# Patient Record
Sex: Female | Born: 1960 | Race: White | Hispanic: No | Smoking: Never smoker
Health system: Southern US, Community
[De-identification: ages and names within clinical notes are randomized; demographics above are authoritative.]

## PROBLEM LIST (undated history)

## (undated) DIAGNOSIS — M810 Age-related osteoporosis without current pathological fracture: Secondary | ICD-10-CM

## (undated) DIAGNOSIS — G4700 Insomnia, unspecified: Secondary | ICD-10-CM

## (undated) HISTORY — DX: Age-related osteoporosis without current pathological fracture: M81.0

## (undated) HISTORY — DX: Insomnia, unspecified: G47.00

---

## 1997-07-28 ENCOUNTER — Other Ambulatory Visit: Admission: RE | Admit: 1997-07-28 | Discharge: 1997-07-28 | Payer: Self-pay | Admitting: *Deleted

## 1998-01-30 ENCOUNTER — Other Ambulatory Visit: Admission: RE | Admit: 1998-01-30 | Discharge: 1998-01-30 | Payer: Self-pay | Admitting: Obstetrics & Gynecology

## 1998-09-13 ENCOUNTER — Inpatient Hospital Stay (HOSPITAL_COMMUNITY): Admission: AD | Admit: 1998-09-13 | Discharge: 1998-09-15 | Payer: Self-pay | Admitting: Obstetrics & Gynecology

## 1998-09-13 ENCOUNTER — Encounter (INDEPENDENT_AMBULATORY_CARE_PROVIDER_SITE_OTHER): Payer: Self-pay | Admitting: Specialist

## 1998-10-31 ENCOUNTER — Other Ambulatory Visit: Admission: RE | Admit: 1998-10-31 | Discharge: 1998-10-31 | Payer: Self-pay | Admitting: Obstetrics & Gynecology

## 2000-02-01 ENCOUNTER — Other Ambulatory Visit: Admission: RE | Admit: 2000-02-01 | Discharge: 2000-02-01 | Payer: Self-pay | Admitting: Obstetrics and Gynecology

## 2000-03-06 ENCOUNTER — Encounter: Admission: RE | Admit: 2000-03-06 | Discharge: 2000-03-06 | Payer: Self-pay | Admitting: Internal Medicine

## 2002-01-21 DIAGNOSIS — I82409 Acute embolism and thrombosis of unspecified deep veins of unspecified lower extremity: Secondary | ICD-10-CM

## 2002-01-21 HISTORY — DX: Acute embolism and thrombosis of unspecified deep veins of unspecified lower extremity: I82.409

## 2003-04-20 ENCOUNTER — Other Ambulatory Visit: Admission: RE | Admit: 2003-04-20 | Discharge: 2003-04-20 | Payer: Self-pay | Admitting: Obstetrics and Gynecology

## 2004-05-01 ENCOUNTER — Other Ambulatory Visit: Admission: RE | Admit: 2004-05-01 | Discharge: 2004-05-01 | Payer: Self-pay | Admitting: Obstetrics and Gynecology

## 2004-12-14 ENCOUNTER — Ambulatory Visit (HOSPITAL_COMMUNITY): Admission: RE | Admit: 2004-12-14 | Discharge: 2004-12-14 | Payer: Self-pay | Admitting: Emergency Medicine

## 2004-12-17 ENCOUNTER — Ambulatory Visit (HOSPITAL_COMMUNITY): Admission: RE | Admit: 2004-12-17 | Discharge: 2004-12-17 | Payer: Self-pay | Admitting: Emergency Medicine

## 2005-05-02 ENCOUNTER — Other Ambulatory Visit: Admission: RE | Admit: 2005-05-02 | Discharge: 2005-05-02 | Payer: Self-pay | Admitting: Obstetrics and Gynecology

## 2006-06-19 ENCOUNTER — Other Ambulatory Visit: Admission: RE | Admit: 2006-06-19 | Discharge: 2006-06-19 | Payer: Self-pay | Admitting: Obstetrics and Gynecology

## 2007-07-03 ENCOUNTER — Other Ambulatory Visit: Admission: RE | Admit: 2007-07-03 | Discharge: 2007-07-03 | Payer: Self-pay | Admitting: Obstetrics and Gynecology

## 2008-07-12 ENCOUNTER — Other Ambulatory Visit: Admission: RE | Admit: 2008-07-12 | Discharge: 2008-07-12 | Payer: Self-pay | Admitting: Obstetrics and Gynecology

## 2009-09-26 ENCOUNTER — Encounter: Admission: RE | Admit: 2009-09-26 | Discharge: 2009-09-26 | Payer: Self-pay | Admitting: Family Medicine

## 2009-09-28 ENCOUNTER — Other Ambulatory Visit: Admission: RE | Admit: 2009-09-28 | Discharge: 2009-09-28 | Payer: Self-pay | Admitting: Obstetrics and Gynecology

## 2010-10-05 ENCOUNTER — Emergency Department (HOSPITAL_COMMUNITY)
Admission: EM | Admit: 2010-10-05 | Discharge: 2010-10-05 | Disposition: A | Discharge status: Home / Self Care | Payer: BC Managed Care – PPO | Attending: Emergency Medicine | Admitting: Emergency Medicine

## 2010-10-05 ENCOUNTER — Emergency Department (HOSPITAL_COMMUNITY): Payer: BC Managed Care – PPO

## 2010-10-05 DIAGNOSIS — F411 Generalized anxiety disorder: Secondary | ICD-10-CM | POA: Insufficient documentation

## 2010-10-05 DIAGNOSIS — Z86718 Personal history of other venous thrombosis and embolism: Secondary | ICD-10-CM | POA: Insufficient documentation

## 2010-10-05 DIAGNOSIS — Z79899 Other long term (current) drug therapy: Secondary | ICD-10-CM | POA: Insufficient documentation

## 2010-10-05 DIAGNOSIS — R079 Chest pain, unspecified: Secondary | ICD-10-CM | POA: Insufficient documentation

## 2010-10-05 LAB — CBC
Platelets: 237 10*3/uL (ref 150–400)
RBC: 4.8 MIL/uL (ref 3.87–5.11)
RDW: 12.1 % (ref 11.5–15.5)
WBC: 5.9 10*3/uL (ref 4.0–10.5)

## 2010-10-05 LAB — DIFFERENTIAL
Basophils Absolute: 0 10*3/uL (ref 0.0–0.1)
Eosinophils Absolute: 0.1 10*3/uL (ref 0.0–0.7)
Eosinophils Relative: 2 % (ref 0–5)
Lymphocytes Relative: 36 % (ref 12–46)
Neutrophils Relative %: 51 % (ref 43–77)

## 2010-10-05 LAB — D-DIMER, QUANTITATIVE: D-Dimer, Quant: 0.35 ug/mL-FEU (ref 0.00–0.48)

## 2010-10-05 LAB — POCT I-STAT TROPONIN I: Troponin i, poc: 0.01 ng/mL (ref 0.00–0.08)

## 2010-10-05 LAB — BASIC METABOLIC PANEL
CO2: 27 mEq/L (ref 19–32)
Chloride: 104 mEq/L (ref 96–112)
GFR calc Af Amer: >60 mL/min (ref >60)
Potassium: 3.7 mEq/L (ref 3.5–5.1)
Sodium: 138 mEq/L (ref 135–145)

## 2011-05-21 ENCOUNTER — Ambulatory Visit (INDEPENDENT_AMBULATORY_CARE_PROVIDER_SITE_OTHER): Payer: BC Managed Care – PPO | Admitting: Internal Medicine

## 2011-05-21 VITALS — BP 129/76 | HR 67 | Temp 97.9°F | Resp 16 | Ht 68.75 in | Wt 183.2 lb

## 2011-05-21 DIAGNOSIS — H9202 Otalgia, left ear: Secondary | ICD-10-CM

## 2011-05-21 DIAGNOSIS — H9209 Otalgia, unspecified ear: Secondary | ICD-10-CM

## 2011-05-21 MED ORDER — PREDNISONE 20 MG PO TABS: ORAL_TABLET | ORAL | Status: AC

## 2011-05-21 MED ORDER — NAPROXEN 500 MG PO TABS: 500.0000 mg | ORAL_TABLET | Freq: Two times a day (BID) | ORAL | Status: AC

## 2011-05-21 MED ORDER — CYCLOBENZAPRINE HCL 10 MG PO TABS: 10.0000 mg | ORAL_TABLET | Freq: Three times a day (TID) | ORAL | Status: AC | PRN

## 2011-05-21 NOTE — Patient Instructions (Signed)
Check with your dentist regarding whether or not you have wisdom teeth that could be contributing or causing problems.  AVOID HOLDING THE TELEPHONE WITH YOUR LEFT EAR.  Take the flexeril, naproxen, and prednisone taper and if your problems persist, see your primary care health provider.  You may need a referral or imaging study to determine the cause of your pain.

## 2011-05-21 NOTE — Progress Notes (Signed)
  Subjective:    Patient ID: Kayla Montoya, female    DOB: [birth year], 51 y.o.   MRN: [id number]  HPI  Kayla Montoya is a 51 year old WF here for pain in her upper left neck and left ear area.  She was seen at the urgent care at [facility] and given a short course of prednisone, mobic which helped for a time but now her pain seems to be more in her left jaw area, hurts when she talks or if she touches her tragus on the left.  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  She does not remember having her wisdom teeth out, she goes regularly to the dentist and is in good health.  She did get relief from the prednisone, though the mobic does not seem to help, she complains of a burning pain in her left ear at times.   Review of Systems  Constitutional: Negative for chills and fatigue.  HENT: Positive for neck pain and neck stiffness. Negative for congestion, rhinorrhea and postnasal drip.   Eyes: Negative.   Respiratory: Negative.   Cardiovascular: Negative.   Gastrointestinal: Negative.   Genitourinary: Negative.   Musculoskeletal: Positive for myalgias.  Skin: Negative.   Neurological: Negative.   Hematological: Negative.   Psychiatric/Behavioral: Negative.   All other systems reviewed and are negative.       Objective:   Physical Exam  Vitals reviewed. Constitutional: She appears well-developed and well-nourished.  HENT:  Head: Normocephalic and atraumatic.  Right Ear: External ear normal.  Left Ear: External ear normal.  Mouth/Throat: Oropharynx is clear and moist.       Left TM is clear, no swelling or erythema in left IAC.  Eyes: Left eye exhibits no discharge.  Neck: Neck supple. No tracheal deviation present. No thyromegaly present.  Cardiovascular: Normal rate, regular rhythm and normal heart sounds.   Pulmonary/Chest: Effort normal and breath sounds normal. She has no wheezes. She has no rales.  Abdominal: Soft.  Musculoskeletal:       Very tight upper left trapezius  with muscle spasm.  Lymphadenopathy:    She has no cervical adenopathy.  Skin: Skin is warm and dry.  Psychiatric: She has a normal mood and affect. Her behavior is normal.          Assessment & Plan:  Suspect her pain is due to musculoskeletal strain.  She is given neck exercises, naproxen, prednisone taper and flexeril.  SHe must stop holding the phone to her left ear!  She can get another device to use to talk on the phone.  She is advised to RTC or to her PCP if this persists as she may need an imaging study or referral if her pain persists, she agrees with this plan. AVS printed and given pt.Marland Kitchen

## 2012-01-10 ENCOUNTER — Ambulatory Visit (INDEPENDENT_AMBULATORY_CARE_PROVIDER_SITE_OTHER): Payer: BC Managed Care – PPO | Admitting: Emergency Medicine

## 2012-01-10 VITALS — BP 144/82 | HR 81 | Temp 98.6°F | Resp 16 | Ht 68.5 in | Wt 166.0 lb

## 2012-01-10 DIAGNOSIS — R059 Cough, unspecified: Secondary | ICD-10-CM

## 2012-01-10 DIAGNOSIS — J209 Acute bronchitis, unspecified: Secondary | ICD-10-CM

## 2012-01-10 DIAGNOSIS — R05 Cough: Secondary | ICD-10-CM

## 2012-01-10 LAB — POCT INFLUENZA A/B
Influenza A, POC: NEGATIVE
Influenza B, POC: NEGATIVE

## 2012-01-10 MED ORDER — HYDROCOD POLST-CHLORPHEN POLST 10-8 MG/5ML PO LQCR: 5.0000 mL | Freq: Two times a day (BID) | ORAL | Status: DC | PRN

## 2012-01-10 MED ORDER — AZITHROMYCIN 250 MG PO TABS: ORAL_TABLET | ORAL | Status: DC

## 2012-01-10 NOTE — Progress Notes (Signed)
[facility] [address] [place] [address] [phone]  Date:  01/10/2012   Name:  Kayla Montoya   DOB:  06/14/1960   MRN:  [id number]  PCP:  No primary provider on file.    Chief Complaint: Cough   History of Present Illness:  Kayla Montoya is a 51 y.o. very pleasant female patient who presents with the following:  Ill past two days with a cough that is not productive and a mucoid post nasal drip.  She has felt "miserable" and unable to work yesterday and tried unsuccessfully today.  Has no fever or chills, nausea or vomiting.  No rash.  Had some loose stool today no blood mucous or pus in stools.  There is no problem list on file for this patient.   History reviewed. No pertinent past medical history.  History reviewed. No pertinent past surgical history.  History  Substance Use Topics  . Smoking status: Never Smoker   . Smokeless tobacco: Not on file  . Alcohol Use: Not on file    History reviewed. No pertinent family history.  No Known Allergies  Medication list has been reviewed and updated.  Current Outpatient Prescriptions on File Prior to Visit  Medication Sig Dispense Refill  . beta carotene w/minerals (OCUVITE) tablet Take 1 tablet by mouth daily.      . calcium-vitamin D (OSCAL WITH D) 500-200 MG-UNIT per tablet Take 1 tablet by mouth daily.      . naproxen (NAPROSYN) 500 MG tablet Take 1 tablet (500 mg total) by mouth 2 (two) times daily with a meal.  30 tablet  0  . zolpidem (AMBIEN) 5 MG tablet Take 5 mg by mouth at bedtime as needed.        Review of Systems:  As per HPI, otherwise negative.    Physical Examination: Filed Vitals:   01/10/12 1431  BP: 144/82  Pulse: 81  Temp: 98.6 F (37 C)  Resp: 16   Filed Vitals:   01/10/12 1431  Height: 5' 8.5" (1.74 m)  Weight: 166 lb (75.297 kg)   Body mass index is 24.87 kg/(m^2). Ideal Body Weight: Weight in (lb) to have BMI = 25: 166.5   GEN: WDWN, NAD,  Non-toxic, A & O x 3 HEENT: Atraumatic, Normocephalic. Neck supple. No masses, No LAD. Ears and Nose: No external deformity. CV: RRR, No M/G/R. No JVD. No thrill. No extra heart sounds. PULM: CTA B, no wheezes, crackles, rhonchi. No retractions. No resp. distress. No accessory muscle use. ABD: S, NT, ND, +BS. No rebound. No HSM. EXTR: No c/c/e NEURO Normal gait.  PSYCH: Normally interactive. Conversant. Not depressed or anxious appearing.  Calm demeanor.    Assessment and Plan: Bronchitis zpak tussionex   Carmelina Dane, MD  Results for orders placed in visit on 01/10/12  POCT INFLUENZA A/B      Component Value Range   Influenza A, POC Negative     Influenza B, POC Negative

## 2012-10-01 IMAGING — CR DG CHEST 2V
2 series · 2 of 2 positions shown · non-contrast
Comparison: None.

CLINICAL DATA: Chest pain and burning.

CHEST - 2 VIEW

[w chest pa]
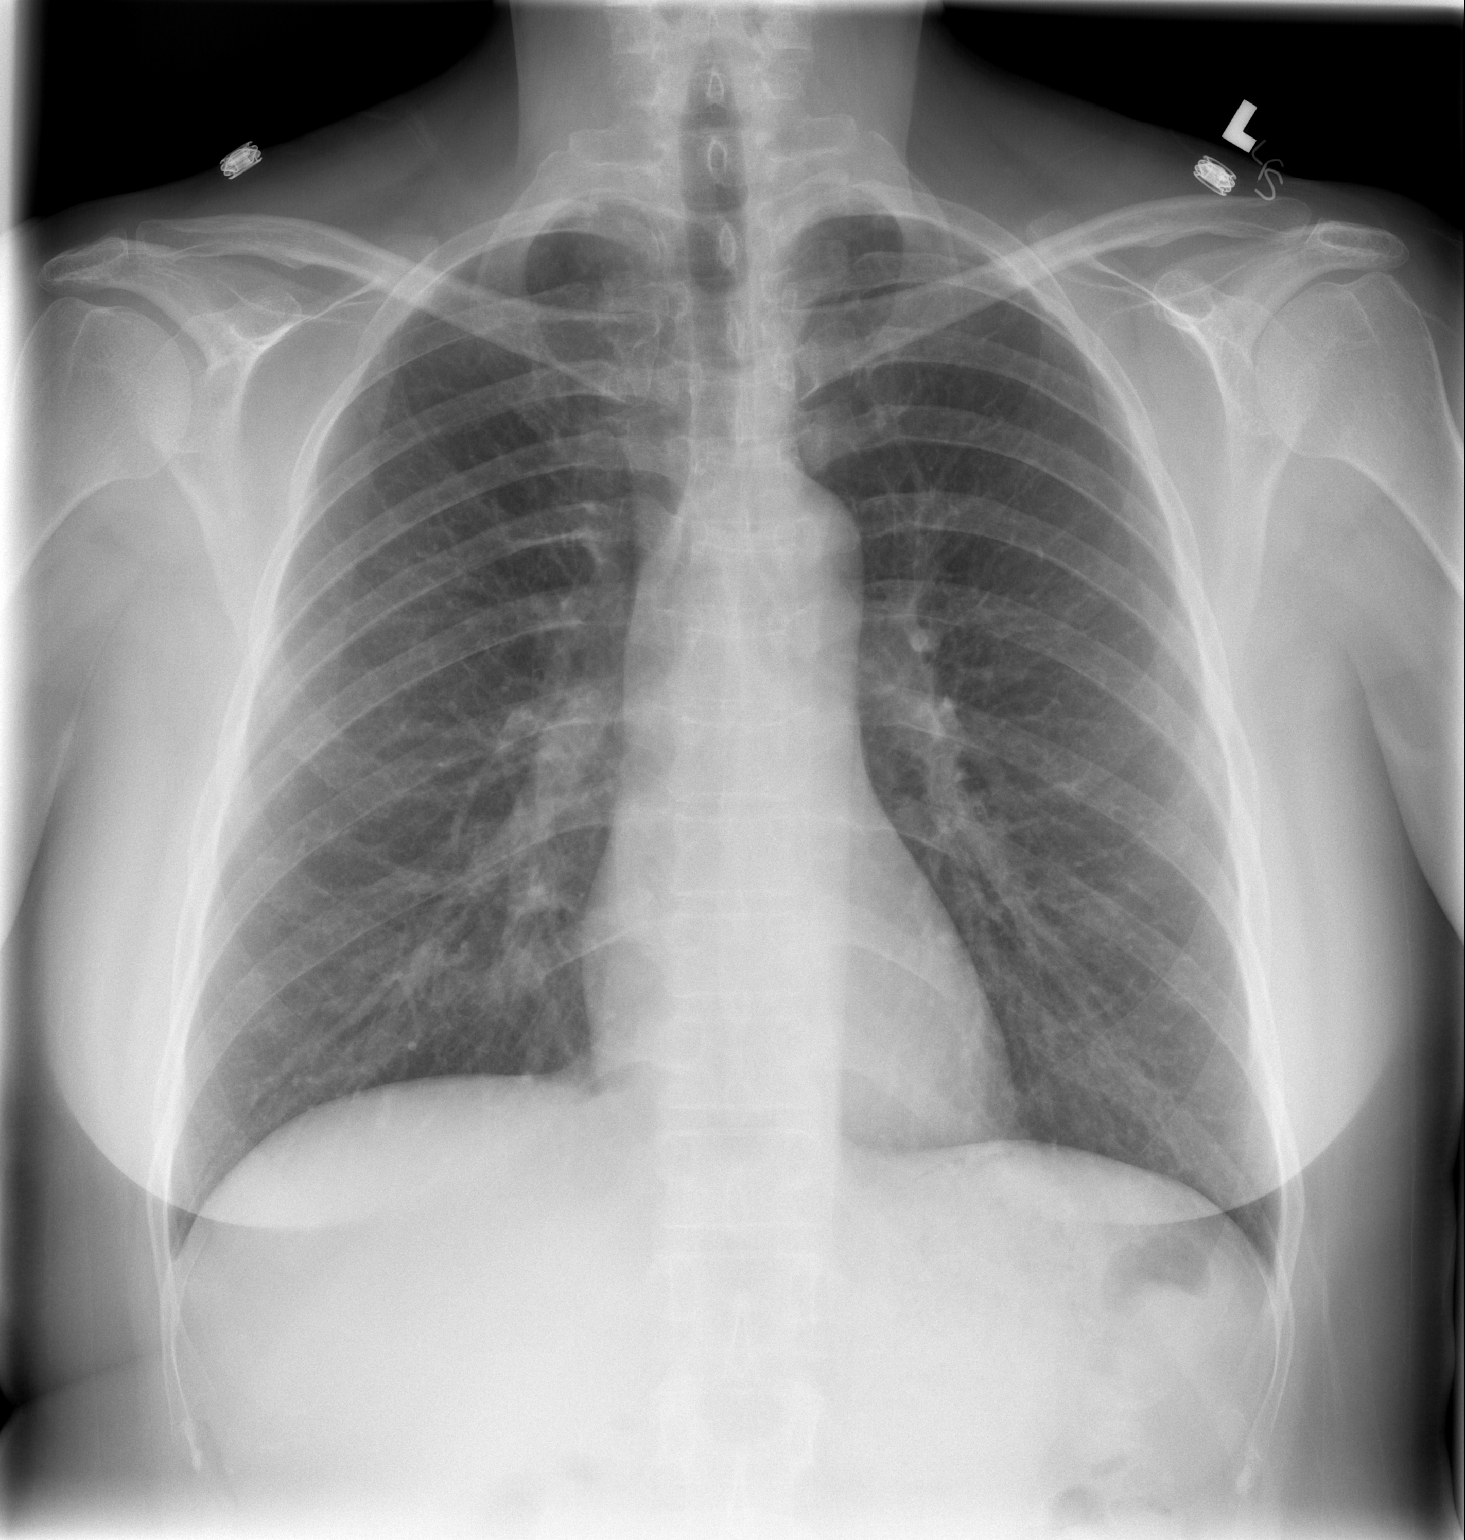

[w chest lat]
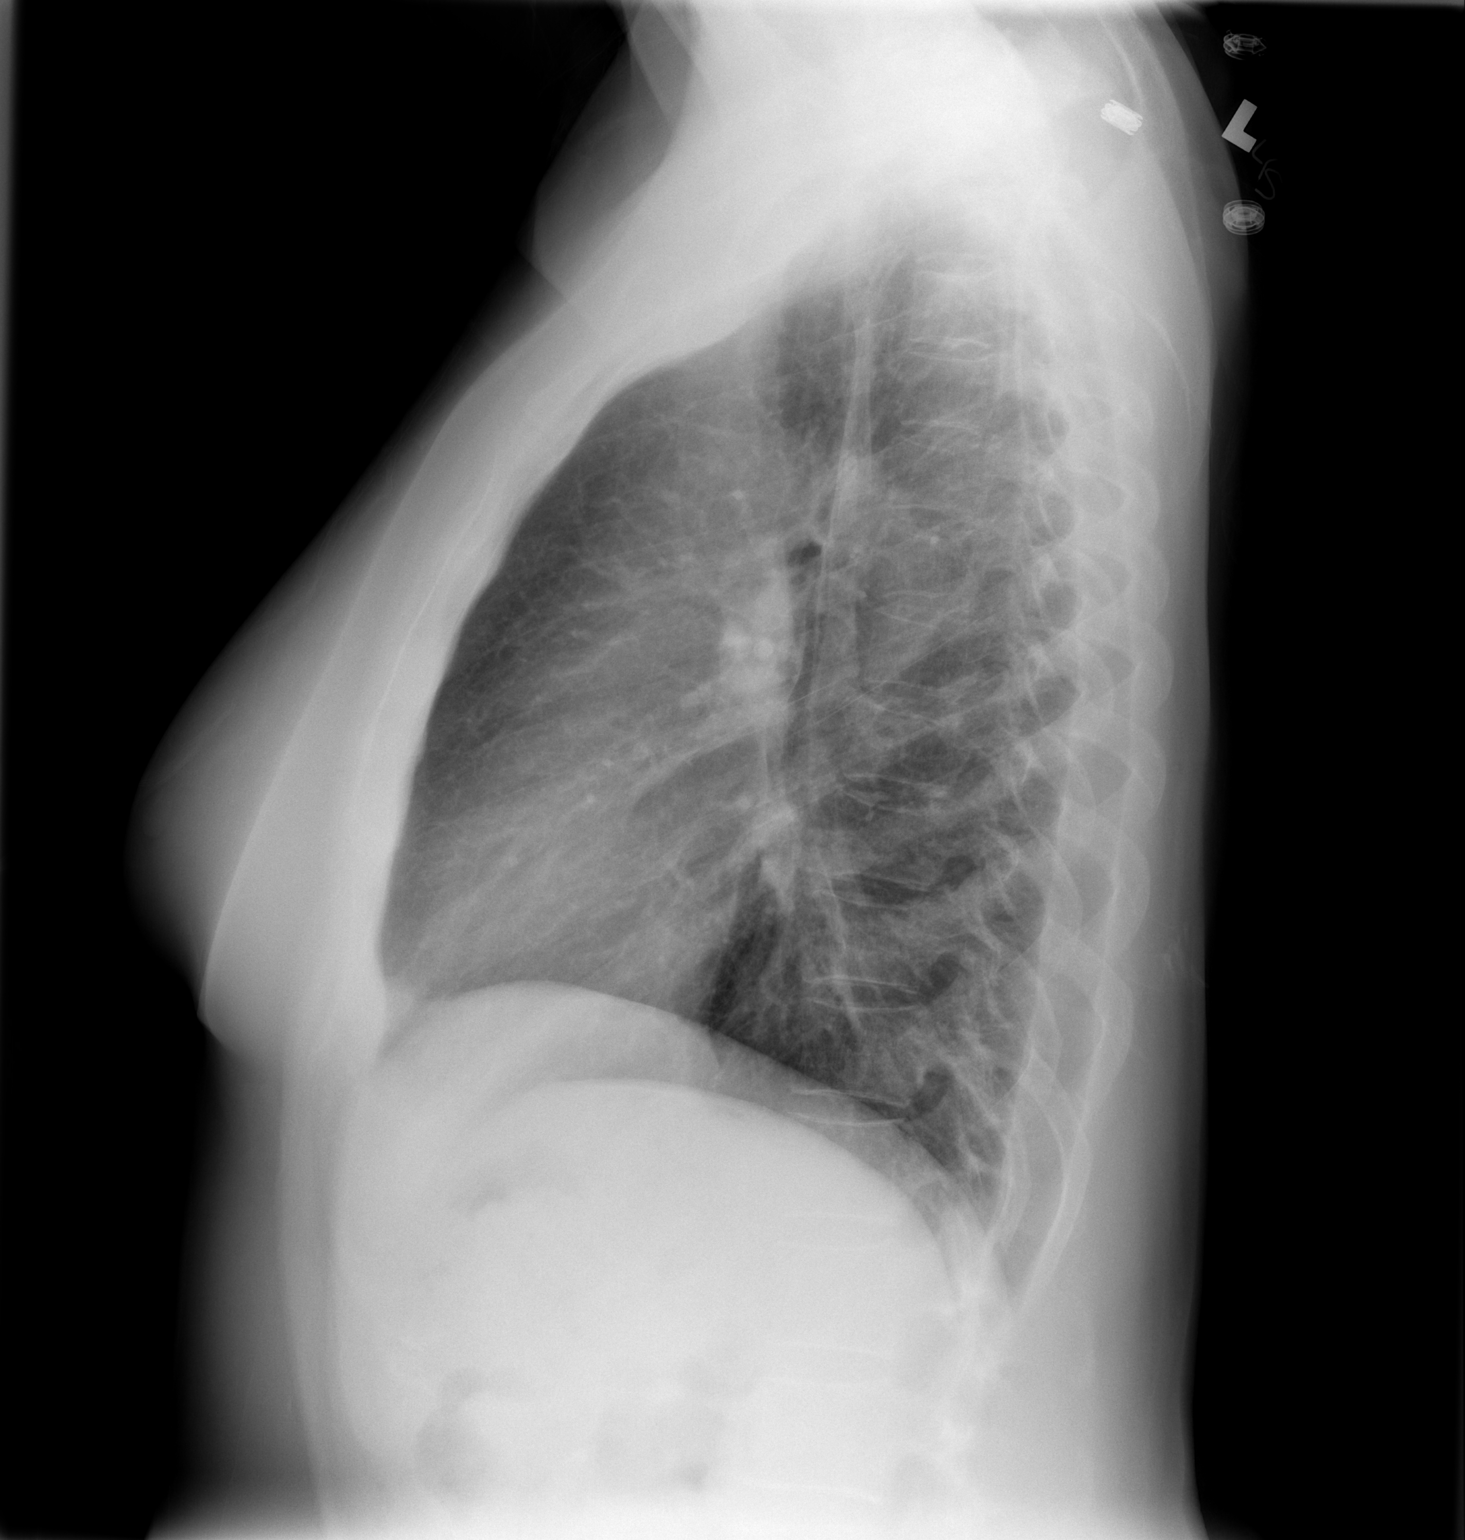

[2 of 2 positions shown; findings below may reference images not displayed]

FINDINGS: The lungs are well-aerated and clear.  There is no
evidence of focal opacification, pleural effusion or pneumothorax.

The heart is normal in size; the mediastinal contour is within
normal limits.  No acute osseous abnormalities are seen.
IMPRESSION: No acute cardiopulmonary process seen.

## 2012-10-29 ENCOUNTER — Other Ambulatory Visit: Payer: Self-pay | Admitting: Nurse Practitioner

## 2012-10-29 ENCOUNTER — Other Ambulatory Visit (HOSPITAL_COMMUNITY)
Admission: RE | Admit: 2012-10-29 | Discharge: 2012-10-29 | Disposition: A | Discharge status: Home / Self Care | Payer: BC Managed Care – PPO | Source: Ambulatory Visit | Attending: Nurse Practitioner | Admitting: Nurse Practitioner

## 2012-10-29 DIAGNOSIS — Z1151 Encounter for screening for human papillomavirus (HPV): Secondary | ICD-10-CM | POA: Insufficient documentation

## 2012-10-29 DIAGNOSIS — Z01419 Encounter for gynecological examination (general) (routine) without abnormal findings: Secondary | ICD-10-CM | POA: Insufficient documentation

## 2015-11-14 ENCOUNTER — Other Ambulatory Visit (HOSPITAL_COMMUNITY)
Admission: RE | Admit: 2015-11-14 | Discharge: 2015-11-14 | Disposition: A | Discharge status: Home / Self Care | Payer: BC Managed Care – PPO | Source: Ambulatory Visit | Attending: Nurse Practitioner | Admitting: Nurse Practitioner

## 2015-11-14 ENCOUNTER — Other Ambulatory Visit: Payer: Self-pay | Admitting: Nurse Practitioner

## 2015-11-14 DIAGNOSIS — Z1151 Encounter for screening for human papillomavirus (HPV): Secondary | ICD-10-CM | POA: Diagnosis present

## 2015-11-14 DIAGNOSIS — Z01419 Encounter for gynecological examination (general) (routine) without abnormal findings: Secondary | ICD-10-CM | POA: Diagnosis present

## 2015-11-14 LAB — HM PAP SMEAR

## 2015-11-15 LAB — CYTOLOGY - PAP
DIAGNOSIS: NEGATIVE
HPV: NOT DETECTED

## 2016-05-30 LAB — HM MAMMOGRAPHY

## 2017-02-05 LAB — HM DEXA SCAN

## 2017-03-04 LAB — VITAMIN D 25 HYDROXY (VIT D DEFICIENCY, FRACTURES): Vit D, 25-Hydroxy: 54.5

## 2017-06-02 LAB — HM MAMMOGRAPHY

## 2017-10-07 ENCOUNTER — Encounter: Payer: Self-pay | Admitting: Family Medicine

## 2017-10-07 ENCOUNTER — Ambulatory Visit: Payer: BC Managed Care – PPO | Admitting: Family Medicine

## 2017-10-07 VITALS — BP 118/78 | HR 73 | Temp 98.2°F | Ht 68.5 in | Wt 179.0 lb

## 2017-10-07 DIAGNOSIS — F5101 Primary insomnia: Secondary | ICD-10-CM | POA: Diagnosis not present

## 2017-10-07 DIAGNOSIS — M722 Plantar fascial fibromatosis: Secondary | ICD-10-CM | POA: Diagnosis not present

## 2017-10-07 DIAGNOSIS — M810 Age-related osteoporosis without current pathological fracture: Secondary | ICD-10-CM | POA: Diagnosis not present

## 2017-10-07 DIAGNOSIS — M222X1 Patellofemoral disorders, right knee: Secondary | ICD-10-CM

## 2017-10-07 DIAGNOSIS — M222X2 Patellofemoral disorders, left knee: Secondary | ICD-10-CM

## 2017-10-07 NOTE — Progress Notes (Signed)
Stevi B Ackerley is a 57 y.o. [facility].   PatieLenard Forthnt Care Team: Helane RimaWallace, Lydie Stammen, DO as PCP - General (Family Medicine)   History of Present Illness:   HPI: Coming from [facility]. Healthy. Prefers no medications. Declines flu shot. Takes several supplements. Hx of PF bilaterally. [facility] and chronic patella laxity.  Health Maintenance Due  Topic Date Due  . Hepatitis C Screening  1960/08/05  . HIV Screening  07/03/1977  . COLONOSCOPY  07/03/2012   Depression screen PHQ 2/9 10/07/2017  Decreased Interest 0  Down, Depressed, Hopeless 0  PHQ - 2 Score 0    PMHx, SurgHx, SocialHx, Medications, and Allergies were reviewed in the Visit Navigator and updated as appropriate.   Past Medical History:  Diagnosis Date  . Insomnia   . Osteoporosis 02/15/2017   BMD done 02/05/17 per [facility]    History reviewed. No pertinent surgical history.   Family History  Problem Relation Age of Onset  . Hypertension Mother   . COPD Father   . Early death Father   . COPD Sister   . Heart disease Maternal Grandfather     Social History   Tobacco Use  . Smoking status: Never Smoker  Substance Use Topics  . Alcohol use: Not on file  . Drug use: Not on file    Current Medications and Allergies:    .  calcium-vitamin D (OSCAL WITH D) 500-200 MG-UNIT per tablet, Take 1 tablet by mouth daily., Disp: , Rfl:  .  zolpidem (AMBIEN) 5 MG tablet, Take 5 mg by mouth at bedtime as needed., Disp: , Rfl:   No Known Allergies   Review of Systems:   Pertinent items are noted in the HPI. Otherwise, ROS is negative.  Vitals:   Vitals:   10/07/17 1515  BP: 118/78  Pulse: 73  Temp: 98.2 F (36.8 C)  TempSrc: Oral  SpO2: 98%  Weight: 179 lb (81.2 kg)  Height: 5' 8.5" (1.74 m)     Body mass index is 26.82 kg/m.  Physical Exam:   Physical Exam  Constitutional: She appears well-nourished.  HENT:  Head: Normocephalic and atraumatic.  Eyes:  Pupils are equal, round, and reactive to light. EOM are normal.  Neck: Normal range of motion. Neck supple.  Cardiovascular: Normal rate, regular rhythm, normal heart sounds and intact distal pulses.  Pulmonary/Chest: Effort normal.  Abdominal: Soft.  Skin: Skin is warm.  Psychiatric: She has a normal mood and affect. Her behavior is normal.  Nursing note and vitals reviewed.  Assessment and Plan:   Jasmine DecemberSharon was seen today for establish care.  Diagnoses and all orders for this visit:  Primary insomnia Comments: Continue Ambien prn.  Patellofemoral pain syndrome of both knees Comments: Hx of patella laxity and subluxation. Followed by Ortho.  Plantar fasciitis, bilateral Comments: Followed by Ortho. Wearing proper shoes. Strethching. Icing. Offered Mobic but she declined.   Osteoporosis, unspecified osteoporosis type, unspecified pathological fracture presence Comments: Declines medication but okay to monitor.     . Reviewed expectations re: course of current medical issues. . Discussed self-management of symptoms. . Outlined signs and symptoms indicating need for more acute intervention. . Patient verbalized understanding and all questions were answered. Marland Kitchen. Health Maintenance issues including appropriate healthy diet, exercise, and smoking avoidance were discussed with patient. . See orders for this visit as documented in the electronic medical record. . Patient received an After Visit Summary.  Helane RimaErica Muhsin Doris, DO [place],  [place] 10/12/2017

## 2017-10-08 ENCOUNTER — Ambulatory Visit: Payer: BC Managed Care – PPO | Admitting: Family Medicine

## 2017-10-12 DIAGNOSIS — M810 Age-related osteoporosis without current pathological fracture: Secondary | ICD-10-CM | POA: Insufficient documentation

## 2017-10-12 DIAGNOSIS — G4700 Insomnia, unspecified: Secondary | ICD-10-CM | POA: Insufficient documentation

## 2018-11-24 ENCOUNTER — Other Ambulatory Visit (HOSPITAL_COMMUNITY)
Admission: RE | Admit: 2018-11-24 | Discharge: 2018-11-24 | Disposition: A | Discharge status: Home / Self Care | Payer: BC Managed Care – PPO | Source: Ambulatory Visit | Attending: Nurse Practitioner | Admitting: Nurse Practitioner

## 2018-11-24 ENCOUNTER — Other Ambulatory Visit: Payer: Self-pay | Admitting: Nurse Practitioner

## 2018-11-24 DIAGNOSIS — Z124 Encounter for screening for malignant neoplasm of cervix: Secondary | ICD-10-CM | POA: Insufficient documentation

## 2018-11-24 LAB — FECAL OCCULT BLOOD, GUAIAC: Fecal Occult Blood: POSITIVE

## 2018-11-26 ENCOUNTER — Other Ambulatory Visit: Payer: Self-pay | Admitting: Nurse Practitioner

## 2018-11-26 DIAGNOSIS — M810 Age-related osteoporosis without current pathological fracture: Secondary | ICD-10-CM

## 2018-11-26 LAB — CYTOLOGY - PAP
Comment: NEGATIVE
Diagnosis: NEGATIVE
High risk HPV: NEGATIVE

## 2019-01-25 ENCOUNTER — Other Ambulatory Visit: Payer: Self-pay

## 2019-01-26 ENCOUNTER — Ambulatory Visit (INDEPENDENT_AMBULATORY_CARE_PROVIDER_SITE_OTHER): Payer: BC Managed Care – PPO | Admitting: Physician Assistant

## 2019-01-26 ENCOUNTER — Encounter: Payer: Self-pay | Admitting: Physician Assistant

## 2019-01-26 VITALS — BP 102/70 | HR 72 | Temp 98.2°F | Ht 68.5 in | Wt 187.0 lb

## 2019-01-26 DIAGNOSIS — F5101 Primary insomnia: Secondary | ICD-10-CM | POA: Diagnosis not present

## 2019-01-26 DIAGNOSIS — M810 Age-related osteoporosis without current pathological fracture: Secondary | ICD-10-CM

## 2019-01-26 NOTE — Patient Instructions (Signed)
It was great to see you!  Take care,  Jazira Maloney PA-C  

## 2019-01-26 NOTE — Progress Notes (Signed)
Kayla Montoya is a 59 y.o. female is here for Transfer of care.  I acted as a Neurosurgeon for [employer], PA-C Corky Mull, LPN  History of Present Illness:   Chief Complaint  Patient presents with  . Transfer of care    from Dr.  Earlene Plater    HPI. Pt here today for transfer of care from Dr. Earlene Plater.  Insomnia -- managed by ob-gyn. has been on [place] since age 52 when husband passed. Currently takes 3-4 nights a week. Denies: unusual dreams, nighttime walking. Only keeps her asleep for a about 5-6 hours when she does take it.  Osteoporosis -- managed by ob-gyn. Fosamax weekly x 6 months or so. Repeat DEXA scan next month. Tolerating medication well without significant heartburn or other side effects.  Health Maintenance Due  Topic Date Due  . Hepatitis C Screening  [birth year]  . COLONOSCOPY  07/03/2012  . MAMMOGRAM  06/03/2018    Past Medical History:  Diagnosis Date  . DVT (deep venous thrombosis) (HCC) 2004  . Insomnia   . Osteoporosis 02/15/2017   BMD done 02/05/17 per [facility]     Social History   Socioeconomic History  . Marital status: Widowed    Spouse name: Not on file  . Number of children: Not on file  . Years of education: Not on file  . Highest education level: Not on file  Occupational History  . Not on file  Tobacco Use  . Smoking status: Never Smoker  . Smokeless tobacco: Never Used  Substance and Sexual Activity  . Alcohol use: Never  . Drug use: Never  . Sexual activity: Not Currently  Other Topics Concern  . Not on file  Social History Narrative   Widow   3 children, we see her daughter Lurena Joiner   Social Determinants of Health   Financial Resource Strain:   . Difficulty of Paying Living Expenses: Not on file  Food Insecurity:   . Worried About Programme researcher, broadcasting/film/video in the Last Year: Not on file  . Ran Out of Food in the Last Year: Not on file  Transportation Needs:   . Lack of Transportation (Medical): Not on file  . Lack of  Transportation (Non-Medical): Not on file  Physical Activity:   . Days of Exercise per Week: Not on file  . Minutes of Exercise per Session: Not on file  Stress:   . Feeling of Stress : Not on file  Social Connections:   . Frequency of Communication with Friends and Family: Not on file  . Frequency of Social Gatherings with Friends and Family: Not on file  . Attends Religious Services: Not on file  . Active Member of Clubs or Organizations: Not on file  . Attends Banker Meetings: Not on file  . Marital Status: Not on file  Intimate Partner Violence:   . Fear of Current or Ex-Partner: Not on file  . Emotionally Abused: Not on file  . Physically Abused: Not on file  . Sexually Abused: Not on file    History reviewed. No pertinent surgical history.  Family History  Problem Relation Age of Onset  . Hypertension Mother   . COPD Father   . Early death Father   . COPD Sister   . Heart disease Maternal Grandfather     PMHx, SurgHx, SocialHx, FamHx, Medications, and Allergies were reviewed in the Visit Navigator and updated as appropriate.   Patient Active Problem List   Diagnosis Date Noted  .  Osteoporosis 10/12/2017  . Insomnia 10/12/2017    Social History   Tobacco Use  . Smoking status: Never Smoker  . Smokeless tobacco: Never Used  Substance Use Topics  . Alcohol use: Never  . Drug use: Never    Current Medications and Allergies:    Current Outpatient Medications:  .  alendronate (FOSAMAX) 70 MG tablet, Take 70 mg by mouth once a week., Disp: , Rfl:  .  calcium-vitamin D (OSCAL WITH D) 500-200 MG-UNIT per tablet, Take 1 tablet by mouth daily., Disp: , Rfl:  .  zolpidem (AMBIEN) 5 MG tablet, Take 5 mg by mouth at bedtime as needed., Disp: , Rfl:   No Known Allergies  Review of Systems   ROS  Negative unless otherwise specified per HPI.  Vitals:   Vitals:   01/26/19 1041  BP: 102/70  Pulse: 72  Temp: 98.2 F (36.8 C)  TempSrc: Temporal    SpO2: 98%  Weight: 187 lb (84.8 kg)  Height: 5' 8.5" (1.74 m)     Body mass index is 28.02 kg/m.   Physical Exam:    Physical Exam Vitals and nursing note reviewed.  Constitutional:      General: She is not in acute distress.    Appearance: She is well-developed. She is not ill-appearing or toxic-appearing.  Cardiovascular:     Rate and Rhythm: Normal rate and regular rhythm.     Pulses: Normal pulses.     Heart sounds: Normal heart sounds, S1 normal and S2 normal.     Comments: No LE edema Pulmonary:     Effort: Pulmonary effort is normal.     Breath sounds: Normal breath sounds.  Skin:    General: Skin is warm and dry.  Neurological:     Mental Status: She is alert.     GCS: GCS eye subscore is 4. GCS verbal subscore is 5. GCS motor subscore is 6.  Psychiatric:        Speech: Speech normal.        Behavior: Behavior normal. Behavior is cooperative.      Assessment and Plan:    Rudi was seen today for transfer of care.  Diagnoses and all orders for this visit:  Primary insomnia Stable. Managed by ob-gyn. Requesting records.  Osteoporosis, unspecified osteoporosis type, unspecified pathological fracture presence Stable. Managed by ob-gyn. Requesting records.  . Reviewed expectations re: course of current medical issues. . Discussed self-management of symptoms. . Outlined signs and symptoms indicating need for more acute intervention. . Patient verbalized understanding and all questions were answered. . See orders for this visit as documented in the electronic medical record. . Patient received an After Visit Summary.  CMA or LPN served as scribe during this visit. History, Physical, and Plan performed by medical provider. The above documentation has been reviewed and is accurate and complete.  Inda Coke, PA-C [place], [place] 01/26/2019  Follow-up: No follow-ups on file.

## 2019-02-06 ENCOUNTER — Encounter: Payer: Self-pay | Admitting: Physician Assistant

## 2019-02-22 ENCOUNTER — Ambulatory Visit
Admission: RE | Admit: 2019-02-22 | Discharge: 2019-02-22 | Disposition: A | Discharge status: Home / Self Care | Payer: BC Managed Care – PPO | Source: Ambulatory Visit | Attending: Nurse Practitioner | Admitting: Nurse Practitioner

## 2019-02-22 ENCOUNTER — Other Ambulatory Visit: Payer: Self-pay

## 2019-02-22 DIAGNOSIS — M810 Age-related osteoporosis without current pathological fracture: Secondary | ICD-10-CM

## 2019-04-19 ENCOUNTER — Other Ambulatory Visit: Payer: Self-pay

## 2019-04-20 ENCOUNTER — Ambulatory Visit (INDEPENDENT_AMBULATORY_CARE_PROVIDER_SITE_OTHER): Payer: BC Managed Care – PPO | Admitting: Physician Assistant

## 2019-04-20 ENCOUNTER — Encounter: Payer: Self-pay | Admitting: Physician Assistant

## 2019-04-20 VITALS — BP 120/80 | HR 76 | Temp 97.7°F | Ht 68.5 in | Wt 186.0 lb

## 2019-04-20 DIAGNOSIS — R1032 Left lower quadrant pain: Secondary | ICD-10-CM | POA: Diagnosis not present

## 2019-04-20 DIAGNOSIS — R195 Other fecal abnormalities: Secondary | ICD-10-CM | POA: Diagnosis not present

## 2019-04-20 DIAGNOSIS — K649 Unspecified hemorrhoids: Secondary | ICD-10-CM | POA: Diagnosis not present

## 2019-04-20 MED ORDER — HYDROCORTISONE ACETATE 25 MG RE SUPP: 25.0000 mg | Freq: Two times a day (BID) | RECTAL | 0 refills | Status: DC

## 2019-04-20 NOTE — Progress Notes (Signed)
Kayla Montoya is a 59 y.o. female here for a new problem.  I acted as a Neurosurgeon for [employer], PA-C Corky Mull, LPN   History of Present Illness:   Chief Complaint  Patient presents with  . Abdominal Pain    LLQ    HPI   LLQ abd pain Pt c/o LLQ pain started 3 weeks ago off and on, felt like burning sensation. Not stabbing or dull. Pain has subsided as of Saturday (3 days ago). Denies urinary symptoms, back pain, constipation, n/v, unintentional weight loss, fever, chills, diarrhea, vaginal bleeding, bloating. Did take Advil for her symptoms, takes it everyday, 400 mg, for knee pain.  April 19th colonoscopy appointment scheduled for screening, [facility]. Had a positive fecal occult in November - which she attributes to likely bleeding hemorrhoid after a hike.  Has a hemorrhoid, has bleeding at times. Eating and drinking without issue.  Denies family hx of colon or gyn cancers.  Wt Readings from Last 4 Encounters:  04/20/19 186 lb (84.4 kg)  01/26/19 187 lb (84.8 kg)  10/07/17 179 lb (81.2 kg)  01/10/12 166 lb (75.3 kg)     Past Medical History:  Diagnosis Date  . DVT (deep venous thrombosis) (HCC) 2004  . Insomnia   . Osteoporosis 02/15/2017   BMD done 02/05/17 per [facility]     Social History   Socioeconomic History  . Marital status: Widowed    Spouse name: Not on file  . Number of children: Not on file  . Years of education: Not on file  . Highest education level: Not on file  Occupational History  . Not on file  Tobacco Use  . Smoking status: Never Smoker  . Smokeless tobacco: Never Used  Substance and Sexual Activity  . Alcohol use: Never  . Drug use: Never  . Sexual activity: Not Currently  Other Topics Concern  . Not on file  Social History Narrative   Widow   3 children, we see her daughter Kayla Montoya   Social Determinants of Health   Financial Resource Strain:   . Difficulty of Paying Living Expenses:   Food Insecurity:   . Worried About  Programme researcher, broadcasting/film/video in the Last Year:   . Barista in the Last Year:   Transportation Needs:   . Freight forwarder (Medical):   Marland Kitchen Lack of Transportation (Non-Medical):   Physical Activity:   . Days of Exercise per Week:   . Minutes of Exercise per Session:   Stress:   . Feeling of Stress :   Social Connections:   . Frequency of Communication with Friends and Family:   . Frequency of Social Gatherings with Friends and Family:   . Attends Religious Services:   . Active Member of Clubs or Organizations:   . Attends Banker Meetings:   Marland Kitchen Marital Status:   Intimate Partner Violence:   . Fear of Current or Ex-Partner:   . Emotionally Abused:   Marland Kitchen Physically Abused:   . Sexually Abused:     History reviewed. No pertinent surgical history.  Family History  Problem Relation Age of Onset  . Hypertension Mother   . COPD Father   . Early death Father   . COPD Sister   . Heart disease Maternal Grandfather     No Known Allergies  Current Medications:   Current Outpatient Medications:  .  alendronate (FOSAMAX) 70 MG tablet, Take 70 mg by mouth once a week., Disp: ,  Rfl:  .  calcium-vitamin D (OSCAL WITH D) 500-200 MG-UNIT per tablet, Take 1 tablet by mouth daily., Disp: , Rfl:  .  zolpidem (AMBIEN) 5 MG tablet, Take 5 mg by mouth at bedtime as needed., Disp: , Rfl:  .  hydrocortisone (ANUSOL-HC) 25 MG suppository, Place 1 suppository (25 mg total) rectally 2 (two) times daily., Disp: 12 suppository, Rfl: 0   Review of Systems:   ROS  Negative unless otherwise specified per HPI.  Vitals:   Vitals:   04/20/19 1024  BP: 120/80  Pulse: 76  Temp: 97.7 F (36.5 C)  TempSrc: Temporal  SpO2: 97%  Weight: 186 lb (84.4 kg)  Height: 5' 8.5" (1.74 m)     Body mass index is 27.87 kg/m.  Physical Exam:   Physical Exam Vitals and nursing note reviewed.  Constitutional:      General: She is not in acute distress.    Appearance: She is well-developed.  She is not ill-appearing or toxic-appearing.  Cardiovascular:     Rate and Rhythm: Normal rate and regular rhythm.     Pulses: Normal pulses.     Heart sounds: Normal heart sounds, S1 normal and S2 normal.     Comments: No LE edema Pulmonary:     Effort: Pulmonary effort is normal.     Breath sounds: Normal breath sounds.  Abdominal:     General: Abdomen is flat. Bowel sounds are normal.     Palpations: Abdomen is soft.     Tenderness: There is no abdominal tenderness. There is no right CVA tenderness, left CVA tenderness, guarding or rebound.  Skin:    General: Skin is warm and dry.  Neurological:     Mental Status: She is alert.     GCS: GCS eye subscore is 4. GCS verbal subscore is 5. GCS motor subscore is 6.  Psychiatric:        Speech: Speech normal.        Behavior: Behavior normal. Behavior is cooperative.     Results for orders placed or performed in visit on 02/06/19  Fecal Occult Blood, Guaiac  Result Value Ref Range   Fecal Occult Blood Positive     Assessment and Plan:   [name] was seen today for abdominal pain.  Diagnoses and all orders for this visit:  LLQ pain Resolved at time of visit. Possible diverticulosis/diverticulitis? No red flags on discussion, other than heme positive stool that has yet to be worked up. Does have colonoscopy consultation scheduled for next month with [facility]. Discussed that if her symptoms return or change, or if unexplained by colonoscopy, low threshold to obtain abdominal/pelvic CT scan for further evaluation and management. -     Ambulatory referral to Gastroenterology  Hemorrhoids, unspecified hemorrhoid type Did not evaluate. Chronic hx per patient. Will trial anusol suppositories. Further follow-up with GI. -     Ambulatory referral to Gastroenterology  Heme positive stool GI referral placed. -     Ambulatory referral to Gastroenterology  Other orders -     hydrocortisone (ANUSOL-HC) 25 MG suppository; Place 1  suppository (25 mg total) rectally 2 (two) times daily.    . Reviewed expectations re: course of current medical issues. . Discussed self-management of symptoms. . Outlined signs and symptoms indicating need for more acute intervention. . Patient verbalized understanding and all questions were answered. . See orders for this visit as documented in the electronic medical record. . Patient received an After-Visit Summary.  CMA or LPN served  as scribe during this visit. History, Physical, and Plan performed by medical provider. The above documentation has been reviewed and is accurate and complete.   Inda Coke, PA-C

## 2019-04-20 NOTE — Patient Instructions (Signed)
It was great to see you!  I have put in a referral for you to see [facility].    Many things can cause belly (abdominal) pain. Most times, belly pain is not dangerous. Many cases of belly pain can be watched and treated at home. Sometimes belly pain is serious, though. Your doctor will try to find the cause of your belly pain.   Follow these instructions at home:  Take over-the-counter and prescription medicines only as told by your doctor. Do not take medicines that help you poop (laxatives) unless told to by your doctor.  Drink enough fluid to keep your pee (urine) clear or pale yellow.  Watch your belly pain for any changes.  Keep all follow-up visits as told by your doctor. This is important.  Contact a doctor if:  Your belly pain changes or gets worse.  You are not hungry, or you lose weight without trying.  You are having trouble pooping (constipated) or have watery poop (diarrhea) for more than 2-3 days.  You have pain when you pee or poop.  Your belly pain wakes you up at night.  Your pain gets worse with meals, after eating, or with certain foods.  You are throwing up and cannot keep anything down.  You have a fever.  Get help right away if:  Your pain does not go away as soon as your doctor says it should.  You cannot stop throwing up.  Your pain is only in areas of your belly, such as the right side or the left lower part of the belly.  You have bloody or black poop, or poop that looks like tar.  You have very bad pain, cramping, or bloating in your belly.  You have signs of not having enough fluid or water in your body (dehydration), such as: ? Dark pee, very little pee, or no pee. ? Cracked lips. ? Dry mouth. ? Sunken eyes. ? Sleepiness. ? Weakness.  Let's follow-up after the colonoscopy if your pain persists, sooner if you have concerns.  Take care,  Jarold Motto PA-C

## 2019-05-19 ENCOUNTER — Encounter: Payer: Self-pay | Admitting: Physician Assistant

## 2019-05-19 LAB — HM COLONOSCOPY

## 2019-06-08 ENCOUNTER — Encounter: Payer: Self-pay | Admitting: Physician Assistant

## 2019-12-20 ENCOUNTER — Other Ambulatory Visit: Payer: Self-pay

## 2019-12-20 ENCOUNTER — Encounter: Payer: Self-pay | Admitting: Physician Assistant

## 2019-12-20 ENCOUNTER — Telehealth (INDEPENDENT_AMBULATORY_CARE_PROVIDER_SITE_OTHER): Payer: BC Managed Care – PPO | Admitting: Physician Assistant

## 2019-12-20 VITALS — Ht 68.5 in | Wt 182.0 lb

## 2019-12-20 DIAGNOSIS — R059 Cough, unspecified: Secondary | ICD-10-CM | POA: Diagnosis not present

## 2019-12-20 MED ORDER — BENZONATATE 100 MG PO CAPS: 100.0000 mg | ORAL_CAPSULE | Freq: Two times a day (BID) | ORAL | 0 refills | Status: DC | PRN

## 2019-12-20 MED ORDER — PREDNISONE 20 MG PO TABS: 40.0000 mg | ORAL_TABLET | Freq: Every day | ORAL | 0 refills | Status: DC

## 2019-12-20 MED ORDER — HYDROCOD POLST-CPM POLST ER 10-8 MG/5ML PO SUER: 5.0000 mL | Freq: Every evening | ORAL | 0 refills | Status: DC | PRN

## 2019-12-20 NOTE — Progress Notes (Signed)
Virtual Visit via Video   I connected with Kayla Montoya on 12/20/19 at  2:00 PM EST by a video enabled telemedicine application and verified that I am speaking with the correct person using two identifiers. Location patient: Home Location provider: [facility], Office Persons participating in the virtual visit: Kayla Montoya, Correira PA-C, Corky Mull, LPN   I discussed the limitations of evaluation and management by telemedicine and the availability of in person appointments. The patient expressed understanding and agreed to proceed.  I acted as a Neurosurgeon for [employer], PA-C [employer], LPN   Subjective:   HPI:   Cough Pt c/o cough over the weekend, she was coughing so hard that she was having mid back pain. Denies fever or chills, no sore throat. Pt took Sudafed for cough, Advil and icy hot for back. Feels like her symptoms are being caused by a tickle in her throat and the cough is causing significant spasms. Has hx of allergies that flare around this time of year. Takes claritin prn. Denies sick contacts, chest pain, SOB, lightheadedness, dizziness.  ROS: See pertinent positives and negatives per HPI.  Patient Active Problem List   Diagnosis Date Noted  . Osteoporosis 10/12/2017  . Insomnia 10/12/2017    Social History   Tobacco Use  . Smoking status: Never Smoker  . Smokeless tobacco: Never Used  Substance Use Topics  . Alcohol use: Never    Current Outpatient Medications:  .  alendronate (FOSAMAX) 70 MG tablet, Take 70 mg by mouth once a week., Disp: , Rfl:  .  calcium-vitamin D (OSCAL WITH D) 500-200 MG-UNIT per tablet, Take 1 tablet by mouth daily., Disp: , Rfl:  .  hydrocortisone (ANUSOL-HC) 25 MG suppository, Place 1 suppository (25 mg total) rectally 2 (two) times daily., Disp: 12 suppository, Rfl: 0 .  zolpidem (AMBIEN) 5 MG tablet, Take 5 mg by mouth at bedtime as needed., Disp: , Rfl:  .  benzonatate (TESSALON) 100 MG capsule,  Take 1 capsule (100 mg total) by mouth 2 (two) times daily as needed for cough., Disp: 20 capsule, Rfl: 0 .  chlorpheniramine-HYDROcodone (TUSSIONEX PENNKINETIC ER) 10-8 MG/5ML SUER, Take 5 mLs by mouth at bedtime as needed for cough., Disp: 140 mL, Rfl: 0 .  predniSONE (DELTASONE) 20 MG tablet, Take 2 tablets (40 mg total) by mouth daily., Disp: 10 tablet, Rfl: 0  No Known Allergies  Objective:   VITALS: Per patient if applicable, see vitals. GENERAL: Alert, appears well and in no acute distress. HEENT: Atraumatic, conjunctiva clear, no obvious abnormalities on inspection of external nose and ears. NECK: Normal movements of the head and neck. CARDIOPULMONARY: No increased WOB. Speaking in clear sentences. I:E ratio WNL.  MS: Moves all visible extremities without noticeable abnormality. PSYCH: Pleasant and cooperative, well-groomed. Speech normal rate and rhythm. Affect is appropriate. Insight and judgement are appropriate. Attention is focused, linear, and appropriate.  NEURO: CN grossly intact. Oriented as arrived to appointment on time with no prompting. Moves both UE equally.  SKIN: No obvious lesions, wounds, erythema, or cyanosis noted on face or hands.  Assessment and Plan:   Jnaya was seen today for cough.  Diagnoses and all orders for this visit:  Cough  Other orders -     predniSONE (DELTASONE) 20 MG tablet; Take 2 tablets (40 mg total) by mouth daily. -     benzonatate (TESSALON) 100 MG capsule; Take 1 capsule (100 mg total) by mouth 2 (two) times daily  as needed for cough. -     chlorpheniramine-HYDROcodone (TUSSIONEX PENNKINETIC ER) 10-8 MG/5ML SUER; Take 5 mLs by mouth at bedtime as needed for cough.   No red flags on discussion  Will initiate oral prednisone for viral cough vs allergic cough per orders. Trial tessalon perles during day and may use tussionex at night (sleep precautions advised.) Discussed taking medications as prescribed. Reviewed return precautions  including worsening fever, SOB, worsening cough or other concerns. Push fluids and rest. I recommend that patient follow-up if symptoms worsen or persist despite treatment x 7-10 days, sooner if needed.  I discussed the assessment and treatment plan with the patient. The patient was provided an opportunity to ask questions and all were answered. The patient agreed with the plan and demonstrated an understanding of the instructions.   The patient was advised to call back or seek an in-person evaluation if the symptoms worsen or if the condition fails to improve as anticipated.   CMA or LPN served as scribe during this visit. History, Physical, and Plan performed by medical provider. The above documentation has been reviewed and is accurate and complete.   [place], [place] 12/20/2019

## 2019-12-24 ENCOUNTER — Other Ambulatory Visit: Payer: Self-pay | Admitting: Physician Assistant

## 2019-12-24 ENCOUNTER — Telehealth: Payer: Self-pay

## 2019-12-24 MED ORDER — PREDNISONE 20 MG PO TABS: 40.0000 mg | ORAL_TABLET | Freq: Every day | ORAL | 0 refills | Status: DC

## 2019-12-24 NOTE — Telephone Encounter (Signed)
LMOVM to return call.

## 2019-12-24 NOTE — Telephone Encounter (Signed)
Please advise 

## 2019-12-24 NOTE — Telephone Encounter (Signed)
I can refill her prednisone for 3 more days but it is too soon for cough syrup refill.  If she needs refill next week, needs to do another virtual appointment with our office prior to [place] filling this.

## 2019-12-24 NOTE — Telephone Encounter (Signed)
MEDICATION: Prednisone & Tussionex  [facility]Y: [facility]  Comments: Pt asked if these could be refilled today. Pt states she is feeling better but is still having a cough and needs it throughout the day at work  **Let patient know to contact pharmacy at the end of the day to make sure medication is ready. **  ** Please notify patient to allow 48-72 hours to process**  **Encourage patient to contact the pharmacy for refills or they can request refills through [facility]**

## 2020-01-07 ENCOUNTER — Telehealth (INDEPENDENT_AMBULATORY_CARE_PROVIDER_SITE_OTHER): Payer: BC Managed Care – PPO | Admitting: Physician Assistant

## 2020-01-07 ENCOUNTER — Other Ambulatory Visit: Payer: Self-pay

## 2020-01-07 ENCOUNTER — Encounter: Payer: Self-pay | Admitting: Physician Assistant

## 2020-01-07 DIAGNOSIS — R059 Cough, unspecified: Secondary | ICD-10-CM

## 2020-01-07 MED ORDER — BENZONATATE 100 MG PO CAPS: 100.0000 mg | ORAL_CAPSULE | Freq: Three times a day (TID) | ORAL | 1 refills | Status: DC | PRN

## 2020-01-07 MED ORDER — HYDROCOD POLST-CPM POLST ER 10-8 MG/5ML PO SUER: 5.0000 mL | Freq: Every evening | ORAL | 0 refills | Status: DC | PRN

## 2020-01-07 NOTE — Progress Notes (Signed)
Virtual Visit via Video   I connected with Kayla Montoya on 01/07/20 at 12:00 PM EST by a video enabled telemedicine application and verified that I am speaking with the correct person using two identifiers. Location patient: Home Location provider: [facility], Office Persons participating in the virtual visit: Kayla, [place] PA-C  I discussed the limitations of evaluation and management by telemedicine and the availability of in person appointments. The patient expressed understanding and agreed to proceed.    Subjective:   HPI:   Cough She last saw me on 12/20/19 virtually for cough. Was given tessalon perles, tussionex and oral prednisone with relief of symptoms. Has had improvement with significant coughing spells.   Continues daily claritin, which has dried up her nasal passages. She reports that she believes she is having significant allergy symptoms with yard work.  She is having a dry, nagging cough at night.   Denies: fevers, chills, malaise, SOB, chest pain, significant productive cough, unintentional weight loss  Gets regular COVID testing at her work. All tests have been negative.  ROS: See pertinent positives and negatives per HPI.  Patient Active Problem List   Diagnosis Date Noted  . Osteoporosis 10/12/2017  . Insomnia 10/12/2017    Social History   Tobacco Use  . Smoking status: Never Smoker  . Smokeless tobacco: Never Used  Substance Use Topics  . Alcohol use: Never    Current Outpatient Medications:  .  alendronate (FOSAMAX) 70 MG tablet, Take 70 mg by mouth once a week., Disp: , Rfl:  .  benzonatate (TESSALON PERLES) 100 MG capsule, Take 1 capsule (100 mg total) by mouth 3 (three) times daily as needed for cough., Disp: 60 capsule, Rfl: 1 .  calcium-vitamin D (OSCAL WITH D) 500-200 MG-UNIT per tablet, Take 1 tablet by mouth daily., Disp: , Rfl:  .  chlorpheniramine-HYDROcodone (TUSSIONEX PENNKINETIC ER) 10-8 MG/5ML SUER,  Take 5 mLs by mouth at bedtime as needed for cough., Disp: 115 mL, Rfl: 0 .  hydrocortisone (ANUSOL-HC) 25 MG suppository, Place 1 suppository (25 mg total) rectally 2 (two) times daily., Disp: 12 suppository, Rfl: 0 .  predniSONE (DELTASONE) 20 MG tablet, Take 2 tablets (40 mg total) by mouth daily., Disp: 6 tablet, Rfl: 0 .  zolpidem (AMBIEN) 5 MG tablet, Take 5 mg by mouth at bedtime as needed., Disp: , Rfl:   No Known Allergies  Objective:   VITALS: Per patient if applicable, see vitals. GENERAL: Alert, appears well and in no acute distress. HEENT: Atraumatic, conjunctiva clear, no obvious abnormalities on inspection of external nose and ears. NECK: Normal movements of the head and neck. CARDIOPULMONARY: No increased WOB. Speaking in clear sentences. I:E ratio WNL.  MS: Moves all visible extremities without noticeable abnormality. PSYCH: Pleasant and cooperative, well-groomed. Speech normal rate and rhythm. Affect is appropriate. Insight and judgement are appropriate. Attention is focused, linear, and appropriate.  NEURO: CN grossly intact. Oriented as arrived to appointment on time with no prompting. Moves both UE equally.  SKIN: No obvious lesions, wounds, erythema, or cyanosis noted on face or hands.  Assessment and Plan:   [name] was seen today for cough.  Diagnoses and all orders for this visit:  Cough No red flags on discussion. Suspect lingering post-viral and allergic cough. Continue claritin. Refill tessalon perles and tussionex. Worsening precautions advised.  Other orders -     chlorpheniramine-HYDROcodone (TUSSIONEX PENNKINETIC ER) 10-8 MG/5ML SUER; Take 5 mLs by mouth at bedtime as needed for  cough. -     Discontinue: benzonatate (TESSALON PERLES) 100 MG capsule; Take 1 capsule (100 mg total) by mouth 3 (three) times daily as needed for cough. -     benzonatate (TESSALON PERLES) 100 MG capsule; Take 1 capsule (100 mg total) by mouth 3 (three) times daily as needed  for cough.    I discussed the assessment and treatment plan with the patient. The patient was provided an opportunity to ask questions and all were answered. The patient agreed with the plan and demonstrated an understanding of the instructions.   The patient was advised to call back or seek an in-person evaluation if the symptoms worsen or if the condition fails to improve as anticipated.    [place], [place] 01/07/2020

## 2020-01-10 ENCOUNTER — Telehealth: Payer: Self-pay

## 2020-01-10 NOTE — Telephone Encounter (Signed)
..   LAST APPOINTMENT DATE: 12/24/2019   NEXT APPOINTMENT DATE:@Visit  date not found  MEDICATION:chlorpheniramine-HYDROcodone (TUSSIONEX PENNKINETIC ER) 10-8 MG/5ML SUER  Pharmacy states they never received this prescription. Pt is asking if we can send it this morning. She states " I really need it."

## 2020-01-12 NOTE — Telephone Encounter (Signed)
Spoke to pt's daughter Lurena Joiner asked her if pt got cough medicine? She said it is at the pharmacy for her to pick up. Told her to tell pt if she can not get cough medicine to please let us know. Lurena Joiner verbalized understanding.

## 2020-08-21 LAB — HM MAMMOGRAPHY

## 2020-08-25 ENCOUNTER — Encounter: Payer: Self-pay | Admitting: Physician Assistant

## 2020-10-06 ENCOUNTER — Other Ambulatory Visit: Payer: Self-pay

## 2020-10-06 ENCOUNTER — Ambulatory Visit: Payer: BC Managed Care – PPO | Admitting: Physician Assistant

## 2020-10-06 ENCOUNTER — Encounter: Payer: Self-pay | Admitting: Physician Assistant

## 2020-10-06 VITALS — BP 120/80 | HR 78 | Temp 97.3°F | Ht 68.5 in | Wt 183.4 lb

## 2020-10-06 DIAGNOSIS — Z01818 Encounter for other preprocedural examination: Secondary | ICD-10-CM | POA: Diagnosis not present

## 2020-10-06 LAB — CBC WITH DIFFERENTIAL/PLATELET
Basophils Absolute: 0 10*3/uL (ref 0.0–0.1)
Basophils Relative: 0.7 % (ref 0.0–3.0)
Eosinophils Absolute: 0.1 10*3/uL (ref 0.0–0.7)
Eosinophils Relative: 2.4 % (ref 0.0–5.0)
HCT: 39.7 % (ref 36.0–46.0)
Hemoglobin: 13.4 g/dL (ref 12.0–15.0)
Lymphocytes Relative: 30.8 % (ref 12.0–46.0)
Lymphs Abs: 1.3 10*3/uL (ref 0.7–4.0)
MCHC: 33.8 g/dL (ref 30.0–36.0)
MCV: 83.2 fl (ref 78.0–100.0)
Monocytes Absolute: 0.4 10*3/uL (ref 0.1–1.0)
Monocytes Relative: 9.9 % (ref 3.0–12.0)
Neutro Abs: 2.3 10*3/uL (ref 1.4–7.7)
Neutrophils Relative %: 56.2 % (ref 43.0–77.0)
Platelets: 230 10*3/uL (ref 150.0–400.0)
RBC: 4.77 Mil/uL (ref 3.87–5.11)
RDW: 12.8 % (ref 11.5–15.5)
WBC: 4.1 10*3/uL (ref 4.0–10.5)

## 2020-10-06 LAB — COMPREHENSIVE METABOLIC PANEL
ALT: 13 U/L (ref 0–35)
AST: 14 U/L (ref 0–37)
Albumin: 4 g/dL (ref 3.5–5.2)
Alkaline Phosphatase: 46 U/L (ref 39–117)
BUN: 18 mg/dL (ref 6–23)
CO2: 26 mEq/L (ref 19–32)
Calcium: 9.3 mg/dL (ref 8.4–10.5)
Chloride: 105 mEq/L (ref 96–112)
Creatinine, Ser: 0.92 mg/dL (ref 0.40–1.20)
GFR: 68.76 mL/min (ref >60.00)
Glucose, Bld: 85 mg/dL (ref 70–99)
Potassium: 4.1 mEq/L (ref 3.5–5.1)
Sodium: 138 mEq/L (ref 135–145)
Total Bilirubin: 0.4 mg/dL (ref 0.2–1.2)
Total Protein: 6.6 g/dL (ref 6.0–8.3)

## 2020-10-06 LAB — PROTIME-INR
INR: 1 ratio (ref 0.8–1.0)
Prothrombin Time: 10.9 s (ref 9.6–13.1)

## 2020-10-06 LAB — HEMOGLOBIN A1C: Hgb A1c MFr Bld: 5.8 % (ref 4.6–6.5)

## 2020-10-06 NOTE — Progress Notes (Signed)
Kayla Montoya is a 60 y.o. female here for a total knee replacement pre op.  History of Present Illness:   Chief Complaint  Patient presents with   Pre-op Exam    Pt is here for surgical clearance. She is scheduled for Right knee Arthroplasty 10/20/2020.    HPI  Right Knee surgery She is having right knee arthroplasty on 10/20/20.Marland Kitchen She has an appointment with physical therapy on Monday before surgery. She has had this injury since junior high playing basketball. She has destroyed 1/3 of her knee cap. She is having issues with stability. Her daughter is a Engineer, civil (consulting) so she will be around to help  her for the first 10 days. She will be on 5 medications post surgery.  Past Medical Hx She has a history of DVT associated with birth control. The blood clot was located behind the knee and she was prescribed blood thinners to dissolves the clot. She denies any adverse effects since.   She denies exertional chest pain, pressure or SOB.  She is able to perform regular duties around her house without any significant chest pain, shortness of breath or other concerns.  Past Medical History:  Diagnosis Date   DVT (deep venous thrombosis) (HCC) 2004   Insomnia      Social History   Tobacco Use   Smoking status: Never   Smokeless tobacco: Never  Vaping Use   Vaping Use: Never used  Substance Use Topics   Alcohol use: Never   Drug use: Never    History reviewed. No pertinent surgical history.  Family History  Problem Relation Age of Onset   Hypertension Mother    COPD Father    Early death Father    COPD Sister    Heart disease Maternal Grandfather     No Known Allergies  Current Medications:   Current Outpatient Medications:    calcium-vitamin D (OSCAL WITH D) 500-200 MG-UNIT per tablet, Take 1 tablet by mouth daily., Disp: , Rfl:    zolpidem (AMBIEN) 5 MG tablet, Take 5 mg by mouth at bedtime as needed., Disp: , Rfl:    alendronate (FOSAMAX) 70 MG tablet, Take 70 mg by mouth once  a week. (Patient not taking: Reported on 10/06/2020), Disp: , Rfl:    Review of Systems:   ROS Negative unless otherwise specified per HPI.  Vitals:   Vitals:   10/06/20 1005  BP: 120/80  Pulse: 78  Temp: (!) 97.3 F (36.3 C)  TempSrc: Temporal  SpO2: 96%  Weight: 183 lb 6.1 oz (83.2 kg)  Height: 5' 8.5" (1.74 m)     Body mass index is 27.48 kg/m.  Physical Exam:   Physical Exam Vitals and nursing note reviewed.  Constitutional:      General: She is not in acute distress.    Appearance: She is well-developed. She is not ill-appearing or toxic-appearing.  Cardiovascular:     Rate and Rhythm: Normal rate and regular rhythm.     Pulses: Normal pulses.     Heart sounds: Normal heart sounds, S1 normal and S2 normal.     Comments: No LE edema Pulmonary:     Effort: Pulmonary effort is normal.     Breath sounds: Normal breath sounds.  Skin:    General: Skin is warm and dry.  Neurological:     Mental Status: She is alert.     GCS: GCS eye subscore is 4. GCS verbal subscore is 5. GCS motor subscore is 6.  Psychiatric:  Speech: Speech normal.        Behavior: Behavior normal. Behavior is cooperative.    Assessment and Plan:   1. Pre-op examination EKG tracing is personally reviewed.  EKG notes NSR.  No acute changes.  No red flags on my exam today or during discussion today Patient is a low risk surgical candidate and I anticipate patient will do well with this surgery We will update blood work per Dr. Wadie Lessen office request  I,Essence Turner,acting as a scribe for Jarold Motto, PA.,have documented all relevant documentation on the behalf of Jarold Motto, PA,as directed by  Jarold Motto, PA while in the presence of Jarold Motto, [place].  I, Jarold Motto, [place], have reviewed all documentation for this visit. The documentation on 10/06/20 for the exam, diagnosis, procedures, and orders are all accurate and complete.  Time spent with patient today was 25  minutes which consisted of chart review, discussing diagnosis, work up, treatment answering questions and documentation.   Jarold Motto, PA-C

## 2020-10-06 NOTE — Patient Instructions (Signed)
It was great to see you!  To prevent constipation while taking pain medications - I recommend over the counter stool softener such as colace and laxative as needed (miralax)  Best of luck with the surgery!  Take care,  Jarold Motto PA-C

## 2021-02-06 ENCOUNTER — Other Ambulatory Visit: Payer: Self-pay | Admitting: Obstetrics and Gynecology

## 2021-02-06 DIAGNOSIS — M810 Age-related osteoporosis without current pathological fracture: Secondary | ICD-10-CM

## 2021-03-08 ENCOUNTER — Ambulatory Visit
Admission: RE | Admit: 2021-03-08 | Discharge: 2021-03-08 | Disposition: A | Discharge status: Home / Self Care | Payer: BC Managed Care – PPO | Source: Ambulatory Visit | Attending: Obstetrics and Gynecology | Admitting: Obstetrics and Gynecology

## 2021-03-08 ENCOUNTER — Other Ambulatory Visit: Payer: Self-pay

## 2021-03-08 DIAGNOSIS — M810 Age-related osteoporosis without current pathological fracture: Secondary | ICD-10-CM

## 2023-02-27 ENCOUNTER — Other Ambulatory Visit: Payer: Self-pay | Admitting: Nurse Practitioner

## 2023-02-27 ENCOUNTER — Encounter: Payer: Self-pay | Admitting: Nurse Practitioner

## 2023-02-27 DIAGNOSIS — M810 Age-related osteoporosis without current pathological fracture: Secondary | ICD-10-CM

## 2023-11-05 ENCOUNTER — Other Ambulatory Visit: Payer: Self-pay

## 2023-11-13 ENCOUNTER — Ambulatory Visit (HOSPITAL_BASED_OUTPATIENT_CLINIC_OR_DEPARTMENT_OTHER)
Admission: RE | Admit: 2023-11-13 | Discharge: 2023-11-13 | Disposition: A | Discharge status: Home / Self Care | Payer: Self-pay | Source: Ambulatory Visit | Attending: Nurse Practitioner | Admitting: Nurse Practitioner

## 2023-11-13 DIAGNOSIS — M810 Age-related osteoporosis without current pathological fracture: Secondary | ICD-10-CM | POA: Insufficient documentation
